# Patient Record
Sex: Female | Born: 2000 | Race: White | Hispanic: No | Marital: Single | Smoking: Never smoker
Health system: Southern US, Community
[De-identification: ages and names within clinical notes are randomized; demographics above are authoritative.]

---
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
## 2021-12-05 ENCOUNTER — Encounter (HOSPITAL_BASED_OUTPATIENT_CLINIC_OR_DEPARTMENT_OTHER): Payer: Self-pay | Admitting: Urology

## 2021-12-05 ENCOUNTER — Emergency Department (HOSPITAL_BASED_OUTPATIENT_CLINIC_OR_DEPARTMENT_OTHER)
Admission: EM | Admit: 2021-12-05 | Discharge: 2021-12-05 | Disposition: A | Discharge status: Home / Self Care | Payer: Self-pay | Attending: Emergency Medicine | Admitting: Emergency Medicine

## 2021-12-05 ENCOUNTER — Other Ambulatory Visit: Payer: Self-pay

## 2021-12-05 ENCOUNTER — Emergency Department (HOSPITAL_BASED_OUTPATIENT_CLINIC_OR_DEPARTMENT_OTHER): Payer: Self-pay

## 2021-12-05 DIAGNOSIS — N838 Other noninflammatory disorders of ovary, fallopian tube and broad ligament: Secondary | ICD-10-CM

## 2021-12-05 DIAGNOSIS — N83201 Unspecified ovarian cyst, right side: Secondary | ICD-10-CM | POA: Insufficient documentation

## 2021-12-05 LAB — URINALYSIS, ROUTINE W REFLEX MICROSCOPIC
Bilirubin Urine: NEGATIVE
Glucose, UA: NEGATIVE mg/dL
Hgb urine dipstick: NEGATIVE
Ketones, ur: NEGATIVE mg/dL
Leukocytes,Ua: NEGATIVE
Nitrite: NEGATIVE
Protein, ur: NEGATIVE mg/dL
Specific Gravity, Urine: 1.02 (ref 1.005–1.030)
pH: 7.5 (ref 5.0–8.0)

## 2021-12-05 LAB — PREGNANCY, URINE: Preg Test, Ur: NEGATIVE

## 2021-12-05 MED ORDER — KETOROLAC TROMETHAMINE 60 MG/2ML IM SOLN
60.0000 mg | Freq: Once | INTRAMUSCULAR | Status: AC
  Administered 2021-12-05: 60 mg via INTRAMUSCULAR
  Filled 2021-12-05: qty 2

## 2021-12-05 MED ORDER — OXYCODONE HCL 5 MG PO TABS: 5.0000 mg | ORAL_TABLET | Freq: Four times a day (QID) | ORAL | 0 refills | Status: AC | PRN

## 2021-12-05 NOTE — ED Provider Notes (Signed)
[facility] EMERGENCY DEPARTMENT Provider Note   CSN: [id number] Arrival date & time: 12/05/21  1249     History  Chief Complaint  Patient presents with   Ovarian Cyst    Kimberly Tate is a 21 y.o. female.  Patient here with right lower pelvic pain.  She was recently diagnosed with a ovarian mass or cyst.  She has been having pain fairly frequently.  Worse when she is walking.  Does not have a lot of pain when she is sitting still or lying.  Plan sounds like they were going to maybe consider doing a surgical removal and biopsy or possibly follow with repeat imaging.  She denies any nausea or vomiting or abdominal pain.  Nothing makes it worse or better.  The history is provided by the patient.       Home Medications Prior to Admission medications   Not on File      Allergies    Patient has no known allergies.    Review of Systems   Review of Systems  Physical Exam Updated Vital Signs BP 130/79   Pulse 90   Temp 98.1 F (36.7 C) (Oral)   Resp 18   Ht 5\' 4"  (1.626 m)   Wt 56.8 kg   LMP 11/10/2021 (Approximate)   SpO2 100%   BMI 21.49 kg/m  Physical Exam Vitals and nursing note reviewed.  Constitutional:      General: She is not in acute distress.    Appearance: She is well-developed.  HENT:     Head: Normocephalic and atraumatic.  Eyes:     Conjunctiva/sclera: Conjunctivae normal.  Cardiovascular:     Rate and Rhythm: Normal rate and regular rhythm.     Heart sounds: No murmur heard. Pulmonary:     Effort: Pulmonary effort is normal. No respiratory distress.     Breath sounds: Normal breath sounds.  Abdominal:     Palpations: Abdomen is soft.     Tenderness: There is no abdominal tenderness.  Musculoskeletal:        General: No swelling.     Cervical back: Neck supple.  Skin:    General: Skin is warm and dry.     Capillary Refill: Capillary refill takes less than 2 seconds.  Neurological:     Mental Status: She is alert.   Psychiatric:        Mood and Affect: Mood normal.     ED Results / Procedures / Treatments   Labs (all labs ordered are listed, but only abnormal results are displayed) Labs Reviewed  PREGNANCY, URINE  URINALYSIS, ROUTINE W REFLEX MICROSCOPIC    EKG None  Radiology 13/02/2021 PELVIC COMPLETE W TRANSVAGINAL AND TORSION R/O  Result Date: 12/05/2021 CLINICAL DATA:  Pelvic pain. Patient reports a right lower quadrant mass. EXAM: TRANSABDOMINAL AND TRANSVAGINAL ULTRASOUND OF PELVIS DOPPLER ULTRASOUND OF OVARIES TECHNIQUE: Both transabdominal and transvaginal ultrasound examinations of the pelvis were performed. Transabdominal technique was performed for global imaging of the pelvis including uterus, ovaries, adnexal regions, and pelvic cul-de-sac. It was necessary to proceed with endovaginal exam following the transabdominal exam to visualize the uterus and ovaries to better advantage. Color and duplex Doppler ultrasound was utilized to evaluate blood flow to the ovaries. COMPARISON:  None Available. FINDINGS: Uterus Measurements: 7.1 x 3.0 x 4.7 cm = volume: 51.8 mL. No fibroids or other mass visualized. Endometrium Thickness: 9 mm.  No focal abnormality visualized. Right ovary Measurements: 5.7 x 5.5 x 5.6 cm = volume:  92.4 mL. Heterogeneous, round right ovarian mass, predominantly of intermediate echogenicity with foci of increased echogenicity. Mass measures approximately 4.5 x 4.7 x 4.9 cm. There is normal color Doppler blood flow around the periphery of this, but not within the mass. Left ovary Measurements: 4.0 x 2.3 x 1.5 cm = volume: 7.1 mL. Normal appearance/no adnexal mass. Pulsed Doppler evaluation of both ovaries demonstrates normal low-resistance arterial and venous waveforms. Other findings No abnormal free fluid. IMPRESSION: 1. 4.9 cm heterogeneous right ovarian mass with no convincing internal blood flow. This may reflect a complex cyst/hemorrhagic cyst. It could reflect a dermoid.  Recommend further characterization with MRI without and with contrast. Alternatively, pelvic CT with contrast be performed since this may be a dermoid and contain macroscopic fat. 2. No acute findings. No evidence of ovarian torsion. No other abnormalities. Electronically Signed   By: Lajean Manes M.D.   On: 12/05/2021 16:04    Procedures Procedures    Medications Ordered in ED Medications  ketorolac (TORADOL) injection 60 mg (60 mg Intramuscular Given 12/05/21 1738)    ED Course/ Medical Decision Making/ A&P                           Medical Decision Making Amount and/or Complexity of Data Reviewed Labs: ordered. Radiology: ordered.  Risk Prescription drug management.   Kimberly Tate is here with with ongoing right pelvic pain.  Was diagnosed with pelvic mass a few weeks ago has been having fairly daily pain with this.  Pain comes and goes.  Worse when she is walking.  She has no other significant medical history.  Per my review of the OB notes there was about a 7 cm mass.  Thought to be may be hemorrhagic cyst or may be endometrioma.  Ultrasound was done today that showed 4.9 cm right ovarian mass.  No evidence of torsion.  Differential also possibly complex cyst versus hemorrhagic cyst.  Overall she is not pregnant.  Urinalysis is negative.  She has no evidence of torsion clinically or on ultrasound.  Patient given a dose of Toradol.  This sounds like her OB/GYN will continue to follow this with possible repeat imaging or possibly surgical removal/biopsy.  Strongly encouraged her to follow-up with her OB outpatient and will give further pain control Roxicodone for breakthrough pain.  However at this time there is no emergent need for further OB workup.  Patient discharged in good condition.  She understands that she needs to follow-up with OB to further evaluate this process.  This chart was dictated using voice recognition software.  Despite best efforts to proofread,  errors can  occur which can change the documentation meaning.         Final Clinical Impression(s) / ED Diagnoses Final diagnoses:  Ovarian mass, right    Rx / DC Orders ED Discharge Orders     None         Lennice Sites, DO 12/05/21 1743

## 2021-12-05 NOTE — Discharge Instructions (Addendum)
Follow-up with your OB/GYN.  Take Roxicodone for breakthrough pain, this medication is sedating so do not mix with alcohol or other drugs or dangerous activities including driving.  Recommend 400 mg ibuprofen every 8 hours as needed for pain.  Recommend 1000 mg of Tylenol every 6 hours as needed for pain.

## 2021-12-05 NOTE — ED Triage Notes (Signed)
Pt states was seen 2 weeks ago at [facility] and there was a mass on right ovary (8 cm per pt) Has appointment on 12/28 Was told to come to ER if pain got worse

## 2021-12-05 NOTE — ED Notes (Signed)
Patient stated that they were going to pass out even when no needle attempted History of needle phobia Will wait to get to room to lay down for blood draw

## 2024-01-14 ENCOUNTER — Encounter: Payer: Self-pay | Admitting: Emergency Medicine

## 2024-01-14 ENCOUNTER — Ambulatory Visit
Admission: EM | Admit: 2024-01-14 | Discharge: 2024-01-14 | Disposition: A | Discharge status: Home / Self Care | Attending: Family Medicine | Admitting: Family Medicine

## 2024-01-14 DIAGNOSIS — R350 Frequency of micturition: Secondary | ICD-10-CM | POA: Insufficient documentation

## 2024-01-14 DIAGNOSIS — N898 Other specified noninflammatory disorders of vagina: Secondary | ICD-10-CM | POA: Diagnosis not present

## 2024-01-14 DIAGNOSIS — R300 Dysuria: Secondary | ICD-10-CM | POA: Diagnosis not present

## 2024-01-14 LAB — POCT URINE DIPSTICK
Bilirubin, UA: NEGATIVE
Blood, UA: NEGATIVE
Glucose, UA: NEGATIVE mg/dL
Ketones, POC UA: NEGATIVE mg/dL
Nitrite, UA: NEGATIVE
POC PROTEIN,UA: NEGATIVE
Spec Grav, UA: 1.01
Urobilinogen, UA: 0.2 U/dL
pH, UA: 5.5

## 2024-01-14 LAB — POCT URINE PREGNANCY: Preg Test, Ur: NEGATIVE

## 2024-01-14 MED ORDER — FLUCONAZOLE 150 MG PO TABS: 150.0000 mg | ORAL_TABLET | Freq: Every day | ORAL | 0 refills | Status: AC

## 2024-01-14 MED ORDER — NITROFURANTOIN MONOHYD MACRO 100 MG PO CAPS: 100.0000 mg | ORAL_CAPSULE | Freq: Two times a day (BID) | ORAL | 0 refills | Status: AC

## 2024-01-14 NOTE — ED Provider Notes (Addendum)
 " [facility]    CSN: [id number] Arrival date & time: 01/14/24  1549      History   Chief Complaint Chief Complaint  Patient presents with   UTI symptoms    HPI Kimberly Tate is a 24 y.o. female presents for urinary frequency.  Patient reports last night she developed urinary frequency without dysuria, urgency, hematuria, fevers vomiting or flank pain.  Also reports vaginal area feels off and thinks she is going to be getting a yeast infection.  No vaginal discharge or STD concern.  No OTC medications have been used.  No other concerns at this time.  HPI  History reviewed. No pertinent past medical history.  There are no active problems to display for this patient.   History reviewed. No pertinent surgical history.  OB History   No obstetric history on file.      Home Medications    Prior to Admission medications  Medication Sig Start Date End Date Taking? Authorizing Provider  fluconazole  (DIFLUCAN ) 150 MG tablet Take 1 tablet (150 mg total) by mouth daily for 2 doses. Take 1 tablet today and you may repeat in 3 days if symptoms persist 01/14/24 01/16/24 Yes Loreda Myla SAUNDERS, NP  nitrofurantoin , macrocrystal-monohydrate, (MACROBID ) 100 MG capsule Take 1 capsule (100 mg total) by mouth 2 (two) times daily. 01/14/24  Yes Loreda Myla SAUNDERS, NP  oxyCODONE  (ROXICODONE ) 5 MG immediate release tablet Take 1 tablet (5 mg total) by mouth every 6 (six) hours as needed for up to 10 doses for severe pain or breakthrough pain. Patient not taking: Reported on 01/14/2024 12/05/21   Ruthe Cornet, DO    Family History No family history on file.  Social History Social History[1]   Allergies   Patient has no known allergies.   Review of Systems Review of Systems  Genitourinary:  Positive for frequency.     Physical Exam Triage Vital Signs ED Triage Vitals  Encounter Vitals Group     BP 01/14/24 1748 123/79     Girls Systolic BP Percentile --      Girls Diastolic BP  Percentile --      Boys Systolic BP Percentile --      Boys Diastolic BP Percentile --      Pulse Rate 01/14/24 1748 70     Resp 01/14/24 1748 16     Temp 01/14/24 1748 98 F (36.7 C)     Temp Source 01/14/24 1748 Oral     SpO2 01/14/24 1748 99 %     Weight 01/14/24 1749 145 lb (65.8 kg)     Height 01/14/24 1749 5' 4 (1.626 m)     Head Circumference --      Peak Flow --      Pain Score 01/14/24 1749 0     Pain Loc --      Pain Education --      Exclude from Growth Chart --    No data found.  Updated Vital Signs BP 123/79 (BP Location: Left Arm)   Pulse 70   Temp 98 F (36.7 C) (Oral)   Resp 16   Ht 5' 4 (1.626 m)   Wt 145 lb (65.8 kg)   LMP 12/14/2023 (Exact Date)   SpO2 99%   BMI 24.89 kg/m   Visual Acuity Right Eye Distance:   Left Eye Distance:   Bilateral Distance:    Right Eye Near:   Left Eye Near:    Bilateral Near:  Physical Exam Vitals and nursing note reviewed.  Constitutional:      Appearance: Normal appearance.  HENT:     Head: Normocephalic and atraumatic.  Eyes:     Pupils: Pupils are equal, round, and reactive to light.  Cardiovascular:     Rate and Rhythm: Normal rate.  Pulmonary:     Effort: Pulmonary effort is normal.  Skin:    General: Skin is warm and dry.  Neurological:     General: No focal deficit present.     Mental Status: She is alert and oriented to person, place, and time.  Psychiatric:        Mood and Affect: Mood normal.        Behavior: Behavior normal.      UC Treatments / Results  Labs (all labs ordered are listed, but only abnormal results are displayed) Labs Reviewed  POCT URINE DIPSTICK - Abnormal; Notable for the following components:      Result Value   Leukocytes, UA Trace (*)    All other components within normal limits  URINE CULTURE  POCT URINE PREGNANCY  CERVICOVAGINAL ANCILLARY ONLY    EKG   Radiology No results found.  Procedures Procedures (including critical care  time)  Medications Ordered in UC Medications - No data to display  Initial Impression / Assessment and Plan / UC Course  I have reviewed the triage vital signs and the nursing notes.  Pertinent labs & imaging results that were available during my care of the patient were reviewed by me and considered in my medical decision making (see chart for details).     Urine hCG negative.  UA with trace leuks otherwise unremarkable.  Will send urine culture.  Patient declines STD testing.  Vaginal swab is ordered and will contact for any positive results.  Will start Macrobid  while awaiting culture results as well as Diflucan .  Encourage rest fluids and PCP or GYN follow-up if symptoms do not improve.  ER precautions reviewed. Final Clinical Impressions(s) / UC Diagnoses   Final diagnoses:  Dysuria  Urinary frequency  Vaginal irritation     Discharge Instructions      The clinic will contact you with results of the testing done today if positive.  Start Macrobid  twice daily for 5 days.  I also start Diflucan  to prevent yeast infection.  Lots of rest and fluids.  Follow-up with your PCP or gynecologist in 2 to 3 days for recheck.  Please go to the ER for any worsening symptoms.  Hope you feel better soon!    ED Prescriptions     Medication Sig Dispense Auth. Provider   nitrofurantoin , macrocrystal-monohydrate, (MACROBID ) 100 MG capsule Take 1 capsule (100 mg total) by mouth 2 (two) times daily. 10 capsule Olliver Boyadjian, Jodi R, NP   fluconazole  (DIFLUCAN ) 150 MG tablet Take 1 tablet (150 mg total) by mouth daily for 2 doses. Take 1 tablet today and you may repeat in 3 days if symptoms persist 2 tablet Diondra Pines, Jodi R, NP      PDMP not reviewed this encounter.    Loreda Myla SAUNDERS, NP 01/14/24 1821     [1]  Social History Tobacco Use   Smoking status: Some Days    Types: Cigarettes    Passive exposure: Past   Smokeless tobacco: Never  Vaping Use   Vaping status: Never Used  Substance Use  Topics   Alcohol use: Yes    Comment: social   Drug use: Never  Loreda Myla SAUNDERS, NP 01/14/24 TRENNA  "

## 2024-01-14 NOTE — ED Triage Notes (Signed)
 Pt c/o urinary frequency with small amounts at times.  Denies any other symptoms

## 2024-01-14 NOTE — Discharge Instructions (Addendum)
 The clinic will contact you with results of the testing done today if positive.  Start Macrobid  twice daily for 5 days.  I also start Diflucan  to prevent yeast infection.  Lots of rest and fluids.  Follow-up with your PCP or gynecologist in 2 to 3 days for recheck.  Please go to the ER for any worsening symptoms.  Hope you feel better soon!

## 2024-01-15 ENCOUNTER — Ambulatory Visit (HOSPITAL_COMMUNITY): Payer: Self-pay

## 2024-01-15 LAB — CERVICOVAGINAL ANCILLARY ONLY
Bacterial Vaginitis (gardnerella): NEGATIVE
Candida Glabrata: NEGATIVE
Candida Vaginitis: POSITIVE — AB
Comment: NEGATIVE
Comment: NEGATIVE
Comment: NEGATIVE

## 2024-01-16 LAB — URINE CULTURE: Culture: 80000 — AB
